# Patient Record
Sex: Male | Born: 1935 | Race: Black or African American | Hispanic: No | Marital: Married | Smoking: Never smoker
Health system: Southern US, Community
[De-identification: ages and names within clinical notes are randomized; demographics above are authoritative.]

## PROBLEM LIST (undated history)

## (undated) DIAGNOSIS — E785 Hyperlipidemia, unspecified: Secondary | ICD-10-CM

## (undated) DIAGNOSIS — E119 Type 2 diabetes mellitus without complications: Secondary | ICD-10-CM

## (undated) DIAGNOSIS — I10 Essential (primary) hypertension: Secondary | ICD-10-CM

## (undated) DIAGNOSIS — I2510 Atherosclerotic heart disease of native coronary artery without angina pectoris: Secondary | ICD-10-CM

## (undated) HISTORY — PX: HERNIA REPAIR: SHX51

## (undated) HISTORY — PX: CATARACT EXTRACTION: SUR2

## (undated) HISTORY — PX: OTHER SURGICAL HISTORY: SHX169

## (undated) HISTORY — PX: BACK SURGERY: SHX140

---

## 2013-05-19 ENCOUNTER — Encounter (HOSPITAL_BASED_OUTPATIENT_CLINIC_OR_DEPARTMENT_OTHER): Payer: Self-pay

## 2013-05-19 ENCOUNTER — Emergency Department (HOSPITAL_BASED_OUTPATIENT_CLINIC_OR_DEPARTMENT_OTHER)
Admission: EM | Admit: 2013-05-19 | Discharge: 2013-05-19 | Disposition: A | Discharge status: Home / Self Care | Payer: Medicare Other | Attending: Emergency Medicine | Admitting: Emergency Medicine

## 2013-05-19 DIAGNOSIS — T783XXA Angioneurotic edema, initial encounter: Secondary | ICD-10-CM

## 2013-05-19 DIAGNOSIS — E119 Type 2 diabetes mellitus without complications: Secondary | ICD-10-CM | POA: Insufficient documentation

## 2013-05-19 DIAGNOSIS — E785 Hyperlipidemia, unspecified: Secondary | ICD-10-CM | POA: Insufficient documentation

## 2013-05-19 DIAGNOSIS — I10 Essential (primary) hypertension: Secondary | ICD-10-CM | POA: Insufficient documentation

## 2013-05-19 DIAGNOSIS — Z7982 Long term (current) use of aspirin: Secondary | ICD-10-CM | POA: Insufficient documentation

## 2013-05-19 DIAGNOSIS — I2510 Atherosclerotic heart disease of native coronary artery without angina pectoris: Secondary | ICD-10-CM | POA: Insufficient documentation

## 2013-05-19 DIAGNOSIS — Z79899 Other long term (current) drug therapy: Secondary | ICD-10-CM | POA: Insufficient documentation

## 2013-05-19 HISTORY — DX: Essential (primary) hypertension: I10

## 2013-05-19 HISTORY — DX: Atherosclerotic heart disease of native coronary artery without angina pectoris: I25.10

## 2013-05-19 HISTORY — DX: Hyperlipidemia, unspecified: E78.5

## 2013-05-19 HISTORY — DX: Type 2 diabetes mellitus without complications: E11.9

## 2013-05-19 MED ORDER — METHYLPREDNISOLONE SODIUM SUCC 125 MG IJ SOLR
125.0000 mg | Freq: Once | INTRAMUSCULAR | Status: AC
  Administered 2013-05-19: 125 mg via INTRAVENOUS
  Filled 2013-05-19: qty 2

## 2013-05-19 MED ORDER — SODIUM CHLORIDE 0.9 % IV BOLUS (SEPSIS)
500.0000 mL | Freq: Once | INTRAVENOUS | Status: AC
  Administered 2013-05-19: 500 mL via INTRAVENOUS

## 2013-05-19 MED ORDER — HYDROCHLOROTHIAZIDE 25 MG PO TABS: 25.0000 mg | ORAL_TABLET | Freq: Every day | ORAL | Status: DC

## 2013-05-19 MED ORDER — DIPHENHYDRAMINE HCL 50 MG/ML IJ SOLN
25.0000 mg | Freq: Once | INTRAMUSCULAR | Status: AC
  Administered 2013-05-19: 25 mg via INTRAVENOUS
  Filled 2013-05-19: qty 1

## 2013-05-19 MED ORDER — FAMOTIDINE IN NACL 20-0.9 MG/50ML-% IV SOLN
20.0000 mg | Freq: Once | INTRAVENOUS | Status: AC
  Administered 2013-05-19: 20 mg via INTRAVENOUS
  Filled 2013-05-19: qty 50

## 2013-05-19 NOTE — ED Notes (Signed)
Right sided facial swelling and lip swelling that started this am (0730).  States he used fixodent this am and had a similar reaction a while back.  No resp distress or difficult swallowing.

## 2013-05-19 NOTE — ED Provider Notes (Signed)
CSN: [id number]     Arrival date & time 05/19/13  1018 History   First MD Initiated Contact with Patient 05/19/13 1043     Chief Complaint  Patient presents with  . Facial Swelling  . Oral Swelling   (Consider location/radiation/quality/duration/timing/severity/associated sxs/prior Treatment) Patient is a 79 y.o. male presenting with allergic reaction. The history is provided by the patient and the spouse.  Allergic Reaction Presenting symptoms: swelling   Presenting symptoms: no difficulty breathing, no difficulty swallowing, no itching, no rash and no wheezing   Swelling:    Location: lips.   Onset quality:  Sudden   Duration:  2 hours   Timing:  Constant   Progression:  Unchanged   Chronicity:  Recurrent Severity:  Mild Prior episodes: several episodes thought to be assoc w/ fixodent for his dentures. Context comment:  Use of fixodent Relieved by:  Nothing Worsened by:  Nothing tried Ineffective treatments:  None tried   Past Medical History  Diagnosis Date  . Hypertension   . Diabetes mellitus without complication   . Hyperlipidemia   . Coronary artery disease    Past Surgical History  Procedure Laterality Date  . Stents in legs    . Hernia repair    . Back surgery    . Cataract extraction     No family history on file. History  Substance Use Topics  . Smoking status: Never Smoker   . Smokeless tobacco: Not on file  . Alcohol Use: No    Review of Systems  Constitutional: Negative for fever.  HENT: Negative for rhinorrhea, drooling, trouble swallowing and neck pain.   Eyes: Negative for pain.  Respiratory: Negative for cough, shortness of breath and wheezing.   Cardiovascular: Negative for chest pain and leg swelling.  Gastrointestinal: Negative for nausea, vomiting, abdominal pain and diarrhea.  Genitourinary: Negative for dysuria and hematuria.  Musculoskeletal: Negative for gait problem.  Skin: Negative for color change, itching and rash.   Neurological: Negative for numbness and headaches.  Hematological: Negative for adenopathy.  Psychiatric/Behavioral: Negative for behavioral problems.  All other systems reviewed and are negative.    Allergies  Review of patient's allergies indicates no known allergies.  Home Medications   Current Outpatient Rx  Name  Route  Sig  Dispense  Refill  . aspirin 81 MG tablet   Oral   Take 81 mg by mouth daily.         . clopidogrel (PLAVIX) 75 MG tablet   Oral   Take 75 mg by mouth daily.         . enalapril-hydrochlorothiazide (VASERETIC) 10-25 MG per tablet   Oral   Take 1 tablet by mouth daily.         Marland Kitchen glipiZIDE (GLUCOTROL) 10 MG tablet   Oral   Take 10 mg by mouth 2 (two) times daily before a meal.         . metFORMIN (GLUCOPHAGE) 1000 MG tablet   Oral   Take by mouth 2 (two) times daily with a meal.         . Multiple Vitamins-Minerals (MULTIVITAMIN PO)   Oral   Take by mouth.         . simvastatin (ZOCOR) 20 MG tablet   Oral   Take 20 mg by mouth every evening.          BP 151/62  Pulse 52  Temp(Src) 98.5 F (36.9 C) (Oral)  Resp 16  Ht 5\' 6"  (1.676 m)  Wt 150 lb (68.04 kg)  BMI 24.22 kg/m2  SpO2 98% Physical Exam  Nursing note and vitals reviewed. Constitutional: He is oriented to person, place, and time. He appears well-developed and well-nourished.  HENT:  Head: Normocephalic and atraumatic.  Right Ear: External ear normal.  Left Ear: External ear normal.  Nose: Nose normal.  Mouth/Throat: Oropharynx is clear and moist. No oropharyngeal exudate.  Mild swelling of upper and lower lips w/ the lower lip displaying greater swelling.   No intra-oral or tongue swelling noted.   Normal appearing posterior oropharynx.   Eyes: Conjunctivae and EOM are normal. Pupils are equal, round, and reactive to light.  Neck: Normal range of motion. Neck supple.  Cardiovascular: Normal rate, regular rhythm, normal heart sounds and intact distal  pulses.  Exam reveals no gallop and no friction rub.   No murmur heard. Pulmonary/Chest: Effort normal and breath sounds normal. No respiratory distress. He has no wheezes.  Abdominal: Soft. Bowel sounds are normal. He exhibits no distension. There is no tenderness. There is no rebound and no guarding.  Musculoskeletal: Normal range of motion. He exhibits no edema and no tenderness.  Neurological: He is alert and oriented to person, place, and time.  Skin: Skin is warm and dry.  Psychiatric: He has a normal mood and affect. His behavior is normal.    ED Course  Procedures (including critical care time) Labs Review Labs Reviewed - No data to display Imaging Review No results found.  MDM   1. Angioedema, initial encounter    11:05 AM 79 y.o. male who presents with lower lip swelling which began at approximately 7 AM this morning. The patient has had similar symptoms when using fixodent for his dentures in the past. He notes that he has never had swelling of his lips before but typically has had swelling of his jaw in the past. The patient is afebrile here and his vital signs are unremarkable. He has no tongue swelling, no shortness of breath, no chest pain, no vomiting or diarrhea. I suspect this is angioedema possibly related to ace inh use as he is on a combo med (vaseretic). Will give std's, H1/H2 blockers and monitor.   2:19 PM: Swelling is resolving slowly, no tongue swelling. Family feels comfortable going home. Gave them strong return precautions for any worsening.  Will have him d/c the combo med and will give Rx for hctz alone. I have discussed the diagnosis/risks/treatment options with the patient and family and believe the pt to be eligible for discharge home to follow-up with pcp next week to discuss BP meds. We also discussed returning to the ED immediately if new or worsening sx occur. We discussed the sx which are most concerning (e.g., worsening swelling, sob) that necessitate  immediate return. Any new prescriptions provided to the patient are listed below.  Discharge Medication List as of 05/19/2013  2:20 PM    START taking these medications   Details  hydrochlorothiazide (HYDRODIURIL) 25 MG tablet Take 1 tablet (25 mg total) by mouth daily., Starting 05/19/2013, Until Discontinued, Print         Junius Argyle, MD 05/20/13 [phone]

## 2015-05-28 ENCOUNTER — Ambulatory Visit (INDEPENDENT_AMBULATORY_CARE_PROVIDER_SITE_OTHER): Payer: Medicare Other | Admitting: Neurology

## 2015-05-28 ENCOUNTER — Encounter: Payer: Self-pay | Admitting: Neurology

## 2015-05-28 VITALS — BP 128/62 | HR 78 | Resp 14 | Ht 66.0 in | Wt 154.6 lb

## 2015-05-28 DIAGNOSIS — M5416 Radiculopathy, lumbar region: Secondary | ICD-10-CM | POA: Diagnosis not present

## 2015-05-28 DIAGNOSIS — M7551 Bursitis of right shoulder: Secondary | ICD-10-CM | POA: Diagnosis not present

## 2015-05-28 DIAGNOSIS — M542 Cervicalgia: Secondary | ICD-10-CM

## 2015-05-28 DIAGNOSIS — M5010 Cervical disc disorder with radiculopathy, unspecified cervical region: Secondary | ICD-10-CM | POA: Diagnosis not present

## 2015-05-28 MED ORDER — TRAMADOL HCL 50 MG PO TABS: 50.0000 mg | ORAL_TABLET | Freq: Three times a day (TID) | ORAL | Status: AC | PRN

## 2015-05-28 NOTE — Progress Notes (Signed)
[facility]  PATIENT: Lucas Brooks DOB: 1931-09-23  REFERRING DOCTOR OR PCP:  Charleston Poot SOURCE: patient and records from Dr. Asher Muir  _________________________________   HISTORICAL  CHIEF COMPLAINT:  Chief Complaint  Patient presents with  . Neck Pain    Lucas Brooks is here with his wife Inez Catalina for eval of neck pain radiating into right shoulder, down right right arm to elbow onset 3 weeks ago. Sts. no relief with oral steroids, Gabapentin, or Tylenol #3/fim    HISTORY OF PRESENT ILLNESS:  I had the pleasure of seeing your patient, Lucas Brooks, for a neurologic consulation regarding his neck and right arm pain and his lower back/hip pain.  He had pain that started in the right neck around April 2016.   Pain was first in his neck but after a few days, he started having right shoulder blade and arm pain.  Pain does not go into the fingers.  Shoulder pain is increased when he externally rotates his arm or reaches behind his head. Neck pain is increased when he looks down. Pain in both locations is reduced when the arm is held closer to his body.   X-ray shows C3C4 and C5C6 DJD (images were reviewed).   X-ray of the shoulder was normal. He has not had an MRI of the cervical spine.  He feels strength is fine.  He notes numbness in the upper arm near the biceps.     He noted pain in the hip and buttock in March and had a lumbar MRI followed by an ESI with benefit.   He also had PT.   He felt better afterwards.    He had an MRI of the lumbar spine showing moderate spinal stenosis at L3L4 and milder spinal stenosis at L4L5 and L2L3.   He had disc protrusion at L5S1 with impingement of the right L5 nerve root  REVIEW OF SYSTEMS: Constitutional: No fevers, chills, sweats, or change in appetite Eyes: No visual changes, double vision, eye pain Ear, nose and throat: No hearing loss, ear pain, nasal congestion, sore throat Cardiovascular: No chest pain,  palpitations Respiratory: No shortness of breath at rest or with exertion.   No wheezes GastrointestinaI: No nausea, vomiting, diarrhea, abdominal pain, fecal incontinence Genitourinary: No dysuria, urinary retention or frequency.  No nocturia. Musculoskeletal: No neck pain, back pain Integumentary: No rash, pruritus, skin lesions Neurological: as above Psychiatric: No depression at this time.  No anxiety Endocrine: No palpitations, diaphoresis, change in appetite, change in weigh or increased thirst Hematologic/Lymphatic: No anemia, purpura, petechiae. Allergic/Immunologic: No itchy/runny eyes, nasal congestion, recent allergic reactions, rashes  ALLERGIES: No Known Allergies  HOME MEDICATIONS:  Current outpatient prescriptions:  .  ACCU-CHEK AVIVA PLUS test strip, , Disp: , Rfl:  .  ACCU-CHEK SOFTCLIX LANCETS lancets, , Disp: , Rfl:  .  acetaminophen-codeine (TYLENOL #3) 300-30 MG per tablet, Take 1 tablet by mouth every 4 (four) hours as needed. for pain, Disp: , Rfl: 0 .  ALPRAZolam (XANAX) 0.5 MG tablet, TAKE 1/2 TAB BY MOUTN UP TO 3 TIMES A DAY AS NEEDED FOR RELAXATION & 1 TAB AT BEDTIME FOR REST, Disp: , Rfl: 1 .  amLODipine (NORVASC) 5 MG tablet, TAKE 1 TABLET(S) BY MOUTH DAILY, Disp: , Rfl: 99 .  aspirin 81 MG tablet, Take 81 mg by mouth daily., Disp: , Rfl:  .  Blood Glucose Monitoring Suppl (ACCU-CHEK AVIVA PLUS) W/DEVICE KIT, , Disp: , Rfl:  .  clopidogrel (PLAVIX) 75 MG tablet, Take  75 mg by mouth daily., Disp: , Rfl:  .  doxazosin (CARDURA) 8 MG tablet, TAKE 1 & 1/2 TABS BY MOUTH EVERY DAY, Disp: , Rfl: 99 .  glipiZIDE (GLUCOTROL) 10 MG tablet, Take 10 mg by mouth 2 (two) times daily before a meal., Disp: , Rfl:  .  losartan (COZAAR) 100 MG tablet, Take 100 mg by mouth daily., Disp: , Rfl: 99 .  metFORMIN (GLUCOPHAGE) 1000 MG tablet, Take by mouth 2 (two) times daily with a meal., Disp: , Rfl:  .  Multiple Vitamins-Minerals (MULTIVITAMIN PO), Take by mouth., Disp: ,  Rfl:  .  simvastatin (ZOCOR) 20 MG tablet, Take 20 mg by mouth every evening., Disp: , Rfl:  .  triamcinolone cream (KENALOG) 0.1 %, APPLY EVERY DAY OR 2X A DAY AS NEEDED, Disp: , Rfl: 2  PAST MEDICAL HISTORY: Past Medical History  Diagnosis Date  . Hypertension   . Diabetes mellitus without complication   . Hyperlipidemia   . Coronary artery disease     PAST SURGICAL HISTORY: Past Surgical History  Procedure Laterality Date  . Stents in legs    . Hernia repair    . Back surgery    . Cataract extraction      FAMILY HISTORY: History reviewed. No pertinent family history.  SOCIAL HISTORY:  Social History   Social History  . Marital Status: Married    Spouse Name: N/A  . Number of Children: N/A  . Years of Education: N/A   Occupational History  . Not on file.   Social History Main Topics  . Smoking status: Never Smoker   . Smokeless tobacco: Not on file  . Alcohol Use: No  . Drug Use: No  . Sexual Activity: Not on file   Other Topics Concern  . Not on file   Social History Narrative     PHYSICAL EXAM  Filed Vitals:   05/28/15 1019  BP: 128/62  Pulse: 78  Resp: 14  Height: $Remove'5\' 6"'[id number]$  (1.676 m)  Weight: 154 lb 9.6 oz (70.126 kg)    Body mass index is 24.96 kg/(m^2).   General: The patient is well-developed and well-nourished and in no acute distress  Neck: The neck is supple, no carotid bruits are noted.  The neck is tender over the right mid to lower cervical paraspinal muscles and the rhomboid muscle..  Cardiovascular: The heart has a regular rate and rhythm with a normal S1 and S2. There were no murmurs, gallops or rubs.    Skin: Extremities are without significant edema.  Musculoskeletal:  Back is nontender.   There is tenderness over the subacromial bursa on the right shoulder  Neurologic Exam  Mental status: The patient is alert and oriented x 3 at the time of the examination. The patient has apparent normal recent and remote memory, with an  apparently normal attention span and concentration ability.   Speech is normal.  Cranial nerves: Extraocular movements are full.   Facial symmetry is present. There is good facial sensation to soft touch bilaterally.Facial strength is normal.  Trapezius and sternocleidomastoid strength is normal. No dysarthria is noted.    No obvious hearing deficits are noted.  Motor:  Muscle bulk is normal.   Tone is normal. Strength is  5 / 5 in all 4 extremities.   Sensory: Sensory testing is intact to pinprick, soft touch and vibration sensation in all 4 extremities.  Coordination: Cerebellar testing reveals good finger-nose-finger and heel-to-shin bilaterally.  Gait and station: Station is  normal.   Gait is normal. Tandem gait is mildly wide. Romberg is negative.   Reflexes: Deep tendon reflexes are symmetric and normal in arms.  He has spread at the knees and ankles are 2+bilaterally.   Plantar responses are flexor.    DIAGNOSTIC DATA (LABS, IMAGING, TESTING) - I reviewed patient records, labs, notes, testing and imaging myself where available.      ASSESSMENT AND PLAN  Bursitis of right shoulder  Cervical disc disorder with radiculopathy of cervical region  Neck pain  Lumbar radicular pain   Mr. Wagley's neck and arm pain appears to be a combination of a cervical radiculopathy and a subacromial bursitis.   His back pain has done better since an epidural steroid earlier in the year.  1.   Subacromial bursa injection with 40 mg Depo-Medrol in [place] using sterile technique.   Several minutes later, pain was improved in the range of motion of the shoulder was better. 2.   TPI right C3-C6 paraspinal muscles and rhomboid muscle with 40 mg Depo-Medrol in [place].   Several minutes later, neck pain was improved. 3.   Tramadol 50 mg for pain 4.   If not better in a week, then check cervical spine MRI and consider ESI based on results   Richard A. Felecia Shelling, MD, PhD 4/85/4627, 03:50  AM Certified in Neurology, Clinical Neurophysiology, Sleep Medicine, Pain Medicine and Neuroimaging  [facility] [address] [place], [place] [address] [phone]

## 2015-08-20 ENCOUNTER — Ambulatory Visit: Payer: Medicare Other | Admitting: Neurology

## 2015-08-26 ENCOUNTER — Ambulatory Visit: Payer: Medicare Other | Admitting: Neurology

## 2019-11-27 ENCOUNTER — Other Ambulatory Visit: Payer: Self-pay | Admitting: Ophthalmology

## 2019-11-27 DIAGNOSIS — H469 Unspecified optic neuritis: Secondary | ICD-10-CM

## 2019-12-26 ENCOUNTER — Ambulatory Visit
Admission: RE | Admit: 2019-12-26 | Discharge: 2019-12-26 | Disposition: A | Discharge status: Home / Self Care | Payer: Medicare Other | Source: Ambulatory Visit | Attending: Ophthalmology | Admitting: Ophthalmology

## 2019-12-26 ENCOUNTER — Other Ambulatory Visit: Payer: Self-pay

## 2019-12-26 DIAGNOSIS — H469 Unspecified optic neuritis: Secondary | ICD-10-CM

## 2021-11-07 IMAGING — MR MR HEAD W/O CM
14 series · 44 of 48 positions shown · non-contrast
Comparison: None.

CLINICAL DATA: Optic neuritis.

EXAM:
MRI HEAD AND ORBITS WITHOUT CONTRAST
TECHNIQUE: Multiplanar, multiecho pulse sequences of the brain, orbits and
surrounding structures were obtained without intravenous contrast.

[Series 2: T1 · sagittal · 5.0mm · 0.45mm/px · 1 of 22 slices shown (1 of 3)]
[im 1/22]
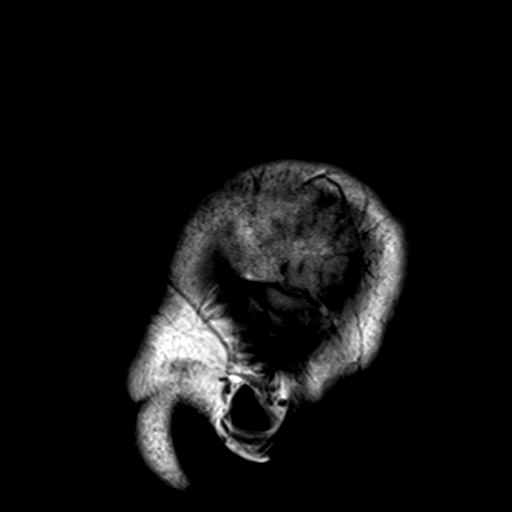

[Series 3: DWI · axial · 3.0mm · 1.80mm/px · z∈[-52,+94]mm · 7 of 100 slices shown (1 of 4)]
[im 1/100]
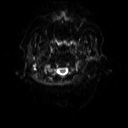
[im 17/100]
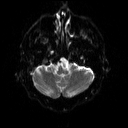
[im 34/100]
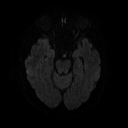
[im 50/100]
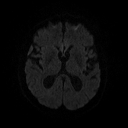
[im 67/100]
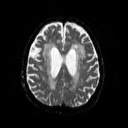
[im 83/100]
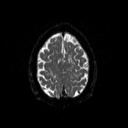
[im 100/100]
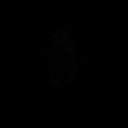

[Series 4: DWI · axial · 3.0mm · 1.80mm/px · z∈[-52,+94]mm · 4 of 49 slices shown (2 of 4)]
[im 1/49]
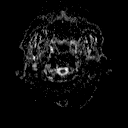
[im 17/49]
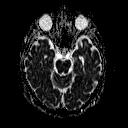
[im 33/49]
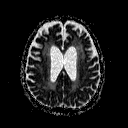
[im 49/49]
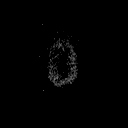

[Series 5: DWI · coronal · 5.0mm · 1.80mm/px · 5 of 67 slices shown (3 of 4)]
[im 1/67]
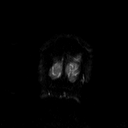
[im 17/67]
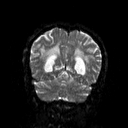
[im 34/67]
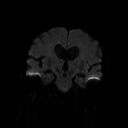
[im 50/67]
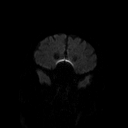
[im 67/67]
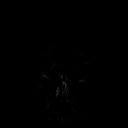

[Series 6: DWI · coronal · 5.0mm · 1.80mm/px · 2 of 34 slices shown (4 of 4)]
[im 1/34]
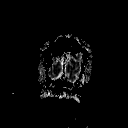
[im 34/34]
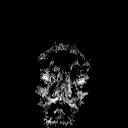

[Series 7: FLAIR · axial · 3.0mm · 0.45mm/px · z∈[-50,+93]mm · 2 of 25 slices shown]
[im 1/25]
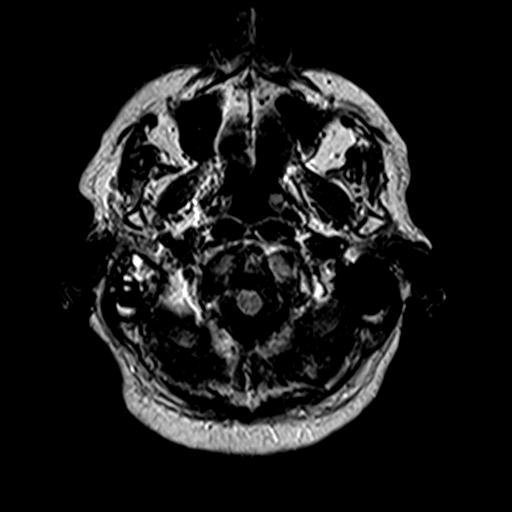
[im 25/25]
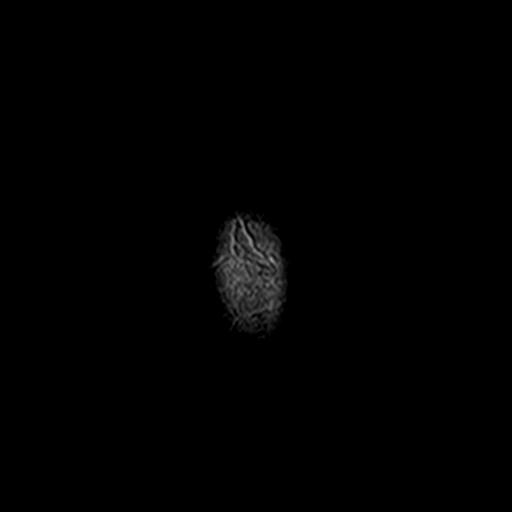

[Series 8: T2 · axial · 5.0mm · 0.51mm/px · z∈[-50,+86]mm · 2 of 23 slices shown (1 of 2)]
[im 1/23]
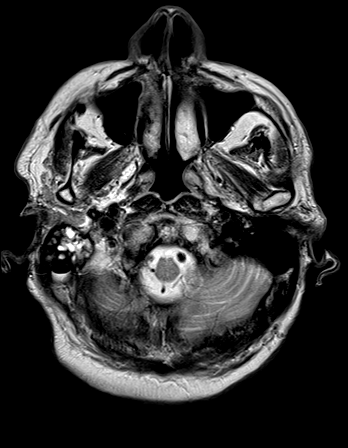
[im 23/23]
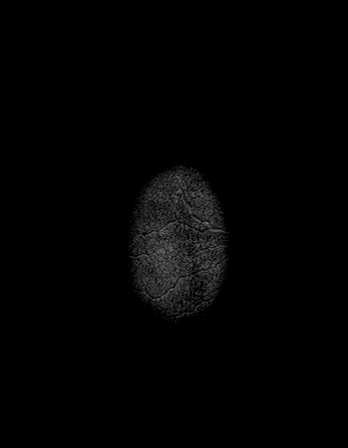

[Series 10: swi_images · axial · 2.0mm · 0.90mm/px · z∈[-58,+99]mm · 6 of 80 slices shown]
[im 1/80]
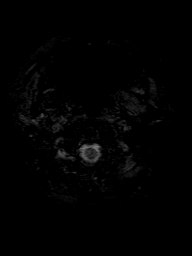
[im 16/80]
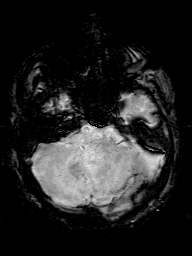
[im 32/80]
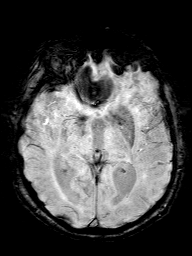
[im 48/80]
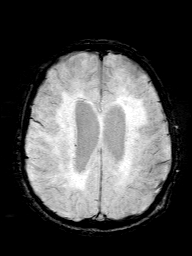
[im 64/80]
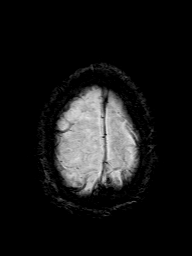
[im 80/80]
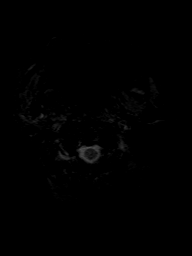

[Series 11: t1_mpr_tra copy center · axial · 1.0mm · 0.45mm/px · z∈[-64,+63]mm · 7 of 160 slices shown]
[im 1/160]
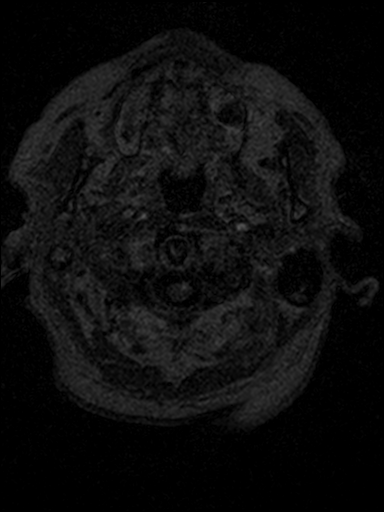
[im 32/160]
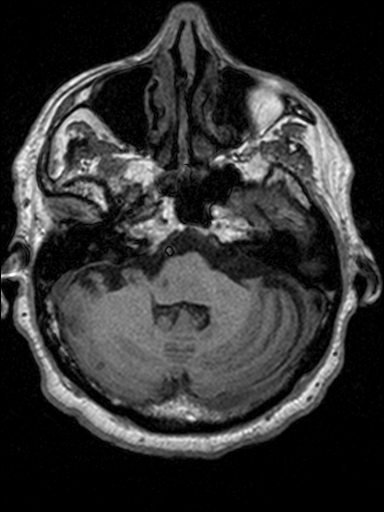
[im 48/160]
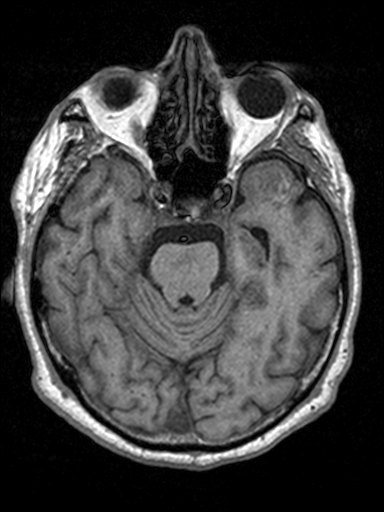
[im 64/160]
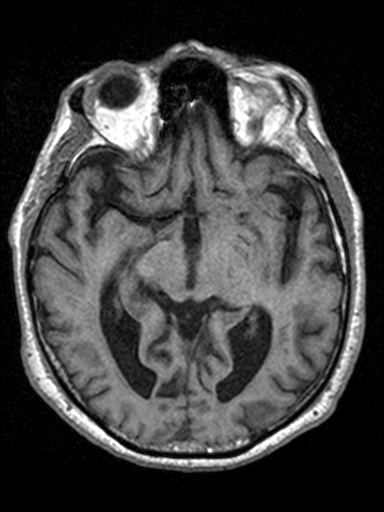
[im 96/160]
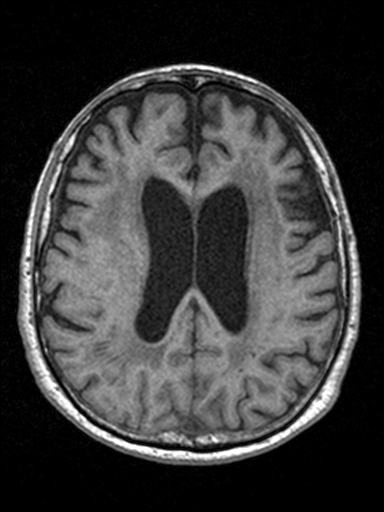
[im 112/160]
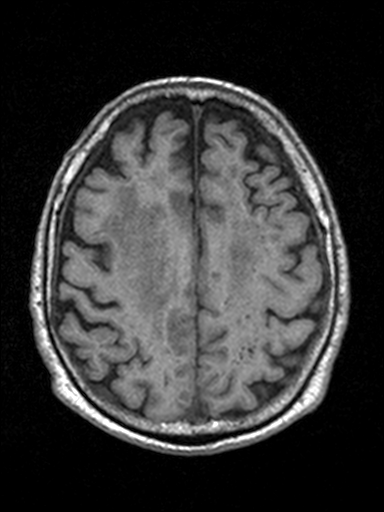
[im 128/160]
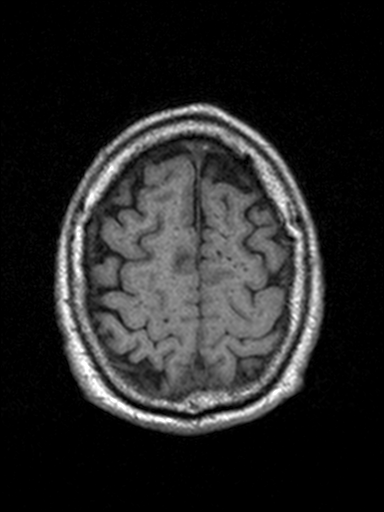

[Series 12: T2 · coronal · 5.0mm · 0.45mm/px · 2 of 28 slices shown (2 of 2)]
[im 1/28]
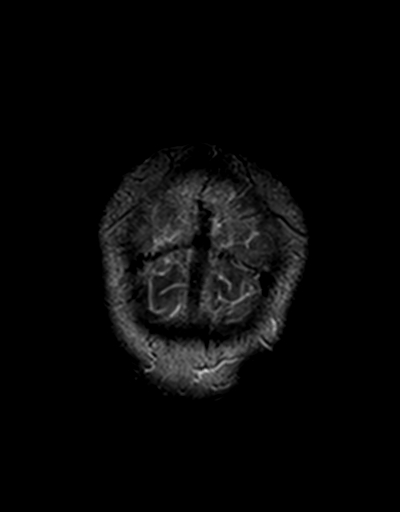
[im 28/28]
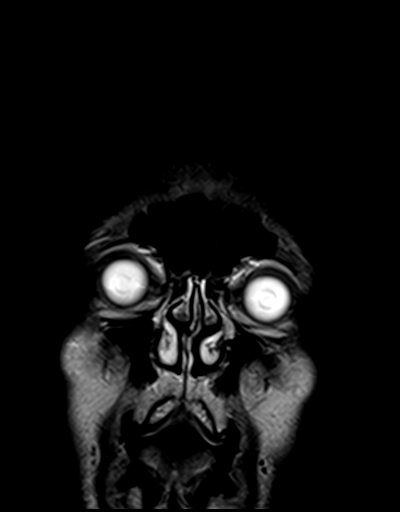

[Series 13: T1 · coronal · 3.0mm · 0.35mm/px · 2 of 25 slices shown (2 of 3)]
[im 1/25]
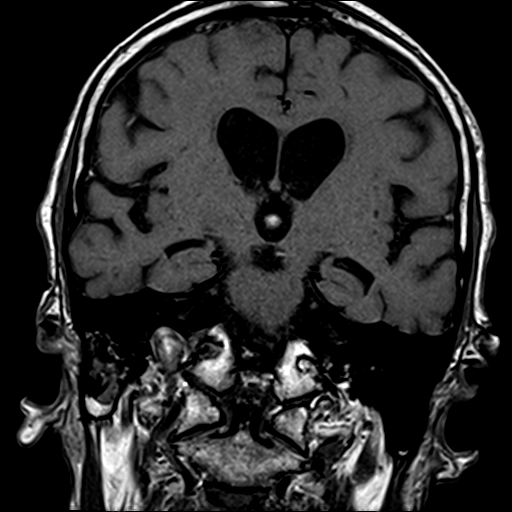
[im 25/25]
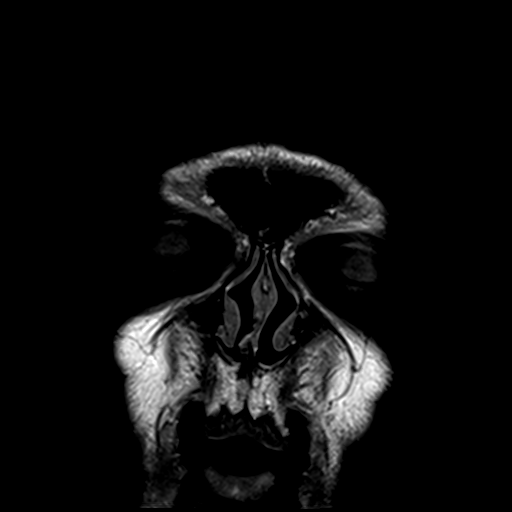

[Series 14: T1 · axial · 3.0mm · 0.35mm/px · 1 of 15 slices shown (3 of 3)]
[im 1/15]
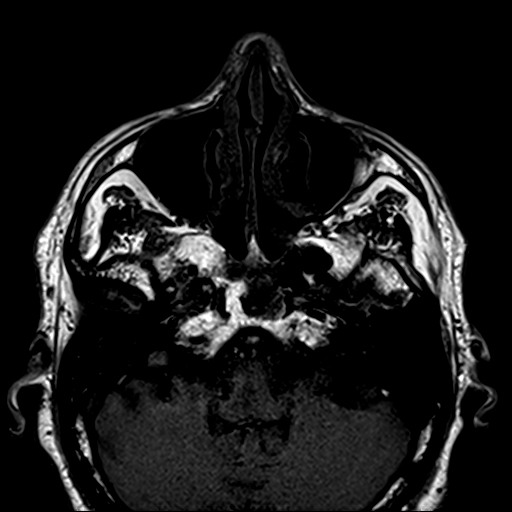

[Series 15: T2 fat-sat · coronal · 3.0mm · 0.47mm/px · 2 of 25 slices shown (1 of 2)]
[im 1/25]
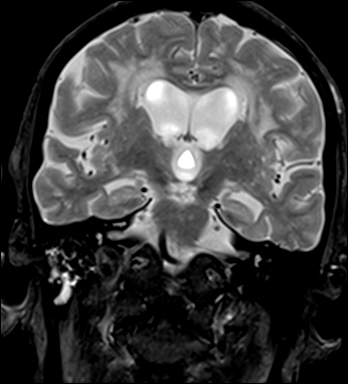
[im 25/25]
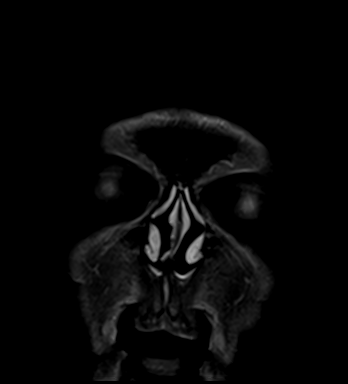

[Series 16: T2 fat-sat · axial · 3.0mm · 0.35mm/px · 1 of 15 slices shown (2 of 2)]
[im 1/15]
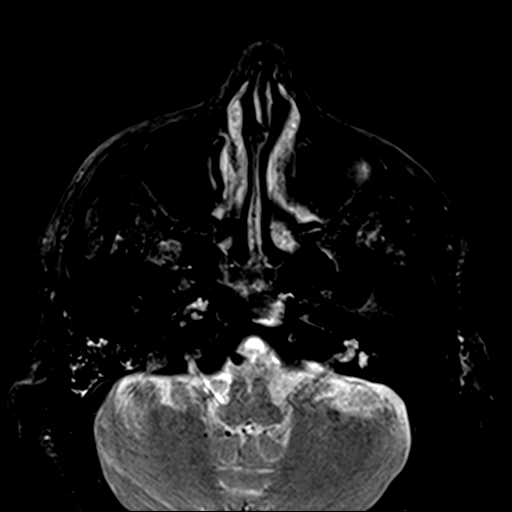

[44 of 48 positions shown; findings below may reference images not displayed]

FINDINGS: Brain: No acute infarction, hemorrhage, hydrocephalus, extra-axial
collection or mass lesion.

Scattered and confluent foci of T2 hyperintensity are seen within
the white matter of the cerebral hemispheres and within the pons,
nonspecific, likely related to chronic small vessel ischemia. Remote
lacunar infarcts are seen in the bilateral thalami at and right
cerebellar hemisphere.

Orbits: The optic nerves are symmetric with normal signal
characteristics. The optic chiasm and optic radiations are
maintained. Bilateral lens surgery noted. The globes are otherwise
preserved. Extraocular musculature and intraconal fat are
maintained. Lacrimal glands are unremarkable.

Vascular: Normal flow voids.

Skull and upper cervical spine: Normal marrow signal.

Sinuses: Mild mucosal thickening of the ethmoid cells.
IMPRESSION: 1. No acute intracranial abnormality.
2. No orbital abnormality.
3. Advanced white matter disease, probable sequela of chronic small
vessel ischemia.
4. Remote lacunar infarcts in the bilateral thalami and right
cerebellar hemisphere.

## 2021-12-06 DEATH — deceased
